# Patient Record
Sex: Female | Born: 1980 | Race: White | Hispanic: No | Marital: Single | Smoking: Former smoker
Health system: Southern US, Community
[De-identification: ages and names within clinical notes are randomized; demographics above are authoritative.]

## PROBLEM LIST (undated history)

## (undated) DIAGNOSIS — K900 Celiac disease: Secondary | ICD-10-CM

## (undated) DIAGNOSIS — K589 Irritable bowel syndrome without diarrhea: Secondary | ICD-10-CM

## (undated) DIAGNOSIS — K219 Gastro-esophageal reflux disease without esophagitis: Secondary | ICD-10-CM

## (undated) HISTORY — DX: Gastro-esophageal reflux disease without esophagitis: K21.9

## (undated) HISTORY — DX: Irritable bowel syndrome, unspecified: K58.9

## (undated) HISTORY — DX: Celiac disease: K90.0

---

## 2006-03-26 HISTORY — PX: KNEE ARTHROSCOPY: SUR90

## 2007-10-25 HISTORY — PX: BREAST REDUCTION SURGERY: SHX8

## 2010-03-26 HISTORY — PX: COLONOSCOPY: SHX174

## 2010-03-26 HISTORY — PX: UPPER GASTROINTESTINAL ENDOSCOPY: SHX188

## 2012-03-13 ENCOUNTER — Ambulatory Visit (INDEPENDENT_AMBULATORY_CARE_PROVIDER_SITE_OTHER): Payer: PRIVATE HEALTH INSURANCE | Admitting: Emergency Medicine

## 2012-03-13 VITALS — BP 107/69 | HR 78 | Temp 98.0°F | Resp 16 | Ht 65.0 in | Wt 162.2 lb

## 2012-03-13 DIAGNOSIS — R6883 Chills (without fever): Secondary | ICD-10-CM

## 2012-03-13 DIAGNOSIS — J029 Acute pharyngitis, unspecified: Secondary | ICD-10-CM

## 2012-03-13 DIAGNOSIS — R059 Cough, unspecified: Secondary | ICD-10-CM

## 2012-03-13 DIAGNOSIS — R05 Cough: Secondary | ICD-10-CM

## 2012-03-13 DIAGNOSIS — R0602 Shortness of breath: Secondary | ICD-10-CM

## 2012-03-13 DIAGNOSIS — R51 Headache: Secondary | ICD-10-CM

## 2012-03-13 DIAGNOSIS — J40 Bronchitis, not specified as acute or chronic: Secondary | ICD-10-CM

## 2012-03-13 LAB — POCT INFLUENZA A/B
Influenza A, POC: NEGATIVE
Influenza B, POC: NEGATIVE

## 2012-03-13 LAB — POCT RAPID STREP A (OFFICE): Rapid Strep A Screen: NEGATIVE

## 2012-03-13 MED ORDER — AZITHROMYCIN 250 MG PO TABS: ORAL_TABLET | ORAL | Status: DC

## 2012-03-13 MED ORDER — HYDROCODONE-HOMATROPINE 5-1.5 MG/5ML PO SYRP: 5.0000 mL | ORAL_SOLUTION | Freq: Three times a day (TID) | ORAL | Status: DC | PRN

## 2012-03-13 NOTE — Progress Notes (Signed)
  Subjective:    Patient ID: Alyssa Koch, female    DOB: 1980/10/24, 31 y.o.   MRN: [id number]  HPI  Sore throat and headache yesterday SOB and cough today Patient denies fever Patient has worked a lot of hours at work recently Patient is from [place], moved her last year and has no history of allergies Patient quit smoking recently Review of Systems     Objective:   Physical Exam TMs are clear. Nose is normal. Neck is supple. Chest exam reveals occasional rhonchi there are no rales. Throat is injected.    Results for orders placed in visit on 03/13/12  POCT RAPID STREP A (OFFICE)      Component Value Range   Rapid Strep A Screen Negative  Negative  POCT INFLUENZA A/B      Component Value Range   Influenza A, POC Negative     Influenza B, POC Negative       Assessment & Plan:  Patient about the risk of infection with bronchitis. We'll treat with a Z-Pak and Hycodan.

## 2012-03-13 NOTE — Patient Instructions (Addendum)

## 2012-04-03 ENCOUNTER — Ambulatory Visit (INDEPENDENT_AMBULATORY_CARE_PROVIDER_SITE_OTHER): Payer: PRIVATE HEALTH INSURANCE | Admitting: Family Medicine

## 2012-04-03 VITALS — BP 110/70 | HR 73 | Temp 99.4°F | Resp 16 | Ht 66.0 in | Wt 167.0 lb

## 2012-04-03 DIAGNOSIS — R51 Headache: Secondary | ICD-10-CM

## 2012-04-03 DIAGNOSIS — J111 Influenza due to unidentified influenza virus with other respiratory manifestations: Secondary | ICD-10-CM

## 2012-04-03 DIAGNOSIS — R6889 Other general symptoms and signs: Secondary | ICD-10-CM

## 2012-04-03 DIAGNOSIS — R059 Cough, unspecified: Secondary | ICD-10-CM

## 2012-04-03 DIAGNOSIS — R05 Cough: Secondary | ICD-10-CM

## 2012-04-03 LAB — POCT INFLUENZA A/B
Influenza A, POC: NEGATIVE
Influenza B, POC: NEGATIVE

## 2012-04-03 MED ORDER — OSELTAMIVIR PHOSPHATE 75 MG PO CAPS: 75.0000 mg | ORAL_CAPSULE | Freq: Two times a day (BID) | ORAL | Status: DC

## 2012-04-03 NOTE — Progress Notes (Signed)
[facility]:  Office Visit  Chief Complaint:  Chief Complaint  Patient presents with  . Shortness of Breath  . Chills  . Fever    HPI: Alyssa Koch is a 32 y.o. female who complains of  HA, fever, chills x 1 day. Was here on 12/19 with similar sxs but this is worse and given z pack and felt better. She took nyquil, zycam, and air borne without relief . Feels extremely tired. . Sxs started yesterday. Exhauseted, msk aches and pains. 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    Past Medical History  Diagnosis Date  . GERD (gastroesophageal reflux disease)   . Celiac disease    No past surgical history on file. History   Social History  . Marital Status: Single    Spouse Name: N/A    Number of Children: N/A  . Years of Education: N/A   Social History Main Topics  . Smoking status: Never Smoker   . Smokeless tobacco: None  . Alcohol Use: 0.0 oz/week    3-5 drink(s) per week  . Drug Use: No  . Sexually Active: Yes    Birth Control/ Protection: None   Other Topics Concern  . None   Social History Narrative  . None   Family History  Problem Relation Age of Onset  . Fibromyalgia Mother   . Celiac disease Sister   . Diabetes Maternal Grandfather   . Diabetes Sister   . Celiac disease Sister    No Known Allergies Prior to Admission medications   Medication Sig Start Date End Date Taking? Authorizing Provider  azithromycin (ZITHROMAX) 250 MG tablet Take 2 tabs PO x 1 dose, then 1 tab PO QD x 4 days 03/13/12   Collene Gobble, MD  HYDROcodone-homatropine [facility]) 5-1.5 MG/5ML syrup Take 5 mLs by mouth every 8 (eight) hours as needed for cough. 03/13/12   Collene Gobble, MD     ROS: The patient denies night sweats, unintentional weight loss, chest pain, palpitations, wheezing, dyspnea on exertion, nausea, vomiting, abdominal pain, dysuria, hematuria, melena, numbness, or tingling.   All other systems have been reviewed and were otherwise  negative with the exception of those mentioned in the HPI and as above.    PHYSICAL EXAM: Filed Vitals:   04/03/12 1736  BP: 110/70  Pulse: 73  Temp: 99.4 F (37.4 C)  Resp: 16  Spo2                 99%  Filed Vitals:   04/03/12 1736  Height: 5\' 6"  (1.676 m)  Weight: 167 lb (75.751 kg)   Body mass index is 26.95 kg/(m^2).  General: Alert, tired appearing HEENT:  Normocephalic, atraumatic, oropharynx patent. TM nl. No sinus tenderness. + erythematoous throat. No exudates. + nasal congestion, erythema Cardiovascular:  Regular rate and rhythm, no rubs murmurs or gallops.  No Carotid bruits, radial pulse intact. No pedal edema.  Respiratory: Clear to auscultation bilaterally.  No wheezes, rales, or rhonchi.  No cyanosis, no use of accessory musculature GI: No organomegaly, abdomen is soft and non-tender, positive bowel sounds.  No masses. Skin: No rashes. Neurologic: Facial musculature symmetric. Psychiatric: Patient is appropriate throughout our interaction. Lymphatic: No cervical lymphadenopathy Musculoskeletal: Gait intact.   LABS: Results for orders placed in visit on 04/03/12  POCT INFLUENZA A/B      Component Value Range   Influenza A, POC Negative     Influenza B, POC  Negative       EKG/XRAY:   Primary read interpreted by Dr. Conley Rolls at [facility].   ASSESSMENT/PLAN: Encounter Diagnoses  Name Primary?  . Cough Yes  . Headache   . Flu-like symptoms    Since she has new onset flu like symptoms < 48 hrs I will treat as if she has the flu Will give Tamiflu 75 mg PO BID x 5 days F/u prn Work note given 3 days.  Push fluids, tulenol, motrin prn. If sxs worsen then f/u CXR       Samar Dass PHUONG, DO 04/03/2012 7:14 PM

## 2012-10-25 ENCOUNTER — Ambulatory Visit (INDEPENDENT_AMBULATORY_CARE_PROVIDER_SITE_OTHER): Payer: PRIVATE HEALTH INSURANCE | Admitting: Emergency Medicine

## 2012-10-25 ENCOUNTER — Ambulatory Visit: Payer: PRIVATE HEALTH INSURANCE

## 2012-10-25 VITALS — BP 100/64 | HR 60 | Temp 97.9°F | Resp 16 | Ht 66.0 in | Wt 160.4 lb

## 2012-10-25 DIAGNOSIS — M79674 Pain in right toe(s): Secondary | ICD-10-CM

## 2012-10-25 DIAGNOSIS — M79609 Pain in unspecified limb: Secondary | ICD-10-CM

## 2012-10-25 MED ORDER — MELOXICAM 15 MG PO TABS: 15.0000 mg | ORAL_TABLET | Freq: Every day | ORAL | Status: DC

## 2012-10-25 NOTE — Progress Notes (Signed)
  Subjective:    Patient ID: Alyssa Koch, female    DOB: [birth year], 32 y.o.   MRN: [id number]  HPI 32 y.o. Female presents to clinic today for right foot pain x 1 month. [place] and stepped in a hole. She has had mild pain on dorsal aspect of foot greatest in her big toe but she has just been taking ibuprofen and dealing with pain. She describes pain as aching and worse when she stands on her toes or has to extend toes.Yesterday and last night she states she had excruciating pain in right big toe. She has noticed redness and swelling in the area. She states she wore different shoes yesterday to work and maybe that aggravated her pain. 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 She has never injured this foot before. She has family history of bunions. no family history of gout.    Review of Systems  Musculoskeletal: Positive for joint swelling. Negative for gait problem.  All other systems reviewed and are negative.      Objective:   Physical Exam  Nursing note and vitals reviewed. Constitutional: She is oriented to person, place, and time. Vital signs are normal. She appears well-developed and well-nourished.  HENT:  Head: Normocephalic and atraumatic.  Right Ear: External ear normal.  Left Ear: External ear normal.  Eyes: Conjunctivae are normal.  Neck: Normal range of motion. Neck supple.  Cardiovascular: Normal rate, regular rhythm and normal heart sounds.   Pulmonary/Chest: Effort normal and breath sounds normal.  Musculoskeletal: Normal range of motion. She exhibits tenderness (right great toe).       Right ankle: Normal. She exhibits normal range of motion, no swelling and normal pulse.       Feet:  Tender to palpation over the MTP joint. Pain with extension and abduction. Mild erythema and swelling. No warmth to palpation. Normal pulses and sensation present. Normal capillary refill.   Neurological: She is alert and oriented  to person, place, and time. She has normal strength and normal reflexes.  Skin: Skin is warm, dry and intact.  Psychiatric: She has a normal mood and affect. Her speech is normal and behavior is normal. Judgment and thought content normal. Cognition and memory are normal.    Right Great Toe radiograph primary reading by Dr. Cleta Alberts. No degenerative changes or acute processes.    Assessment & Plan:  Pain of great toe, right - Plan: DG Toe Great Right, meloxicam (MOBIC) 15 MG tablet  Patient appears to have sprain of right great toe MTP. Instructed to stop Ibuprofen while taking Meloxicam. She was fitted for post op shoe and instructed to wear it throughout day to help support her toe. Advised her to wear supportive shoes, ice, and try to keep off of foot as much as possible. Instructed her that if she is still having pain in 2 weeks please come back for further evaluation.

## 2012-10-25 NOTE — Patient Instructions (Addendum)
Stop Ibuprofen while taking Meloxicam Wear post op shoe throughout day to help support toe Wear supportive shoes, ice, and try to keep off of foot as much as possible If you are still having pain in 2 weeks please come back for further evaluation

## 2012-10-25 NOTE — Progress Notes (Signed)
I have examined this patient along with the student and agree.  

## 2012-11-11 ENCOUNTER — Emergency Department (HOSPITAL_COMMUNITY)
Admission: EM | Admit: 2012-11-11 | Discharge: 2012-11-12 | Disposition: A | Discharge status: Home / Self Care | Payer: PRIVATE HEALTH INSURANCE | Attending: Emergency Medicine | Admitting: Emergency Medicine

## 2012-11-11 ENCOUNTER — Emergency Department (HOSPITAL_COMMUNITY): Payer: PRIVATE HEALTH INSURANCE

## 2012-11-11 ENCOUNTER — Encounter (HOSPITAL_COMMUNITY): Payer: Self-pay | Admitting: Emergency Medicine

## 2012-11-11 DIAGNOSIS — Z3202 Encounter for pregnancy test, result negative: Secondary | ICD-10-CM | POA: Insufficient documentation

## 2012-11-11 DIAGNOSIS — R110 Nausea: Secondary | ICD-10-CM | POA: Insufficient documentation

## 2012-11-11 DIAGNOSIS — F17200 Nicotine dependence, unspecified, uncomplicated: Secondary | ICD-10-CM | POA: Insufficient documentation

## 2012-11-11 DIAGNOSIS — Z8719 Personal history of other diseases of the digestive system: Secondary | ICD-10-CM | POA: Insufficient documentation

## 2012-11-11 DIAGNOSIS — R197 Diarrhea, unspecified: Secondary | ICD-10-CM | POA: Insufficient documentation

## 2012-11-11 DIAGNOSIS — R6883 Chills (without fever): Secondary | ICD-10-CM | POA: Insufficient documentation

## 2012-11-11 DIAGNOSIS — R1031 Right lower quadrant pain: Secondary | ICD-10-CM | POA: Insufficient documentation

## 2012-11-11 DIAGNOSIS — R109 Unspecified abdominal pain: Secondary | ICD-10-CM

## 2012-11-11 LAB — CBC WITH DIFFERENTIAL/PLATELET
Basophils Absolute: 0 10*3/uL (ref 0.0–0.1)
HCT: 41.4 % (ref 36.0–46.0)
Lymphocytes Relative: 30 % (ref 12–46)
Lymphs Abs: 3 10*3/uL (ref 0.7–4.0)
Neutro Abs: 6 10*3/uL (ref 1.7–7.7)
Platelets: 218 10*3/uL (ref 150–400)
RBC: 4.54 MIL/uL (ref 3.87–5.11)
RDW: 12.5 % (ref 11.5–15.5)
WBC: 10 10*3/uL (ref 4.0–10.5)

## 2012-11-11 LAB — URINALYSIS, ROUTINE W REFLEX MICROSCOPIC
Bilirubin Urine: NEGATIVE
Glucose, UA: NEGATIVE mg/dL
Hgb urine dipstick: NEGATIVE
Ketones, ur: NEGATIVE mg/dL
Protein, ur: NEGATIVE mg/dL

## 2012-11-11 LAB — COMPREHENSIVE METABOLIC PANEL
ALT: 12 U/L (ref 0–35)
AST: 15 U/L (ref 0–37)
Alkaline Phosphatase: 48 U/L (ref 39–117)
CO2: 26 mEq/L (ref 19–32)
Chloride: 103 mEq/L (ref 96–112)
GFR calc non Af Amer: >90 mL/min (ref >90)
Sodium: 138 mEq/L (ref 135–145)
Total Bilirubin: 0.2 mg/dL — ABNORMAL LOW (ref 0.3–1.2)

## 2012-11-11 LAB — POCT PREGNANCY, URINE: Preg Test, Ur: NEGATIVE

## 2012-11-11 MED ORDER — IOHEXOL 300 MG/ML  SOLN
50.0000 mL | Freq: Once | INTRAMUSCULAR | Status: AC | PRN
  Administered 2012-11-11: 50 mL via ORAL

## 2012-11-11 MED ORDER — IOHEXOL 300 MG/ML  SOLN
100.0000 mL | Freq: Once | INTRAMUSCULAR | Status: AC | PRN
  Administered 2012-11-11: 100 mL via INTRAVENOUS

## 2012-11-11 MED ORDER — ONDANSETRON HCL 4 MG/2ML IJ SOLN
4.0000 mg | Freq: Once | INTRAMUSCULAR | Status: AC
  Administered 2012-11-11: 4 mg via INTRAVENOUS
  Filled 2012-11-11: qty 2

## 2012-11-11 MED ORDER — SODIUM CHLORIDE 0.9 % IV BOLUS (SEPSIS)
1000.0000 mL | Freq: Once | INTRAVENOUS | Status: AC
  Administered 2012-11-11: 1000 mL via INTRAVENOUS

## 2012-11-11 MED ORDER — MORPHINE SULFATE 4 MG/ML IJ SOLN
4.0000 mg | Freq: Once | INTRAMUSCULAR | Status: AC
  Administered 2012-11-11: 4 mg via INTRAVENOUS
  Filled 2012-11-11: qty 1

## 2012-11-11 NOTE — ED Notes (Signed)
Pt states she has celiac disease since about 2 or 3 today she has had severe abd pain   Pt states she has diarrhea and nausea without vomiting

## 2012-11-11 NOTE — ED Provider Notes (Signed)
TIME SEEN: 10:03 PM  CHIEF COMPLAINT: Abdominal pain, nausea, diarrhea  HPI: Patient is a 32 year old female with a history of celiac disease and acid reflux who presents emergency department with acute onset lower abdominal pain worse in the right lower corner that started tonight after eating a steak salad. She has had nausea and diarrhea but no vomiting. She has had chills but no fever. No dysuria or hematuria. No vaginal bleeding or discharge. No prior history of abdominal surgery. No sick contacts. She doubts any bad food exposure. No recent hospitalization or antibiotic use.  ROS: See HPI Constitutional: no fever  Eyes: no drainage  ENT: no runny nose   Cardiovascular:  no chest pain  Resp: no SOB  GI: no vomiting GU: no dysuria Integumentary: no rash  Allergy: no hives  Musculoskeletal: no leg swelling  Neurological: no slurred speech ROS otherwise negative  PAST MEDICAL HISTORY/PAST SURGICAL HISTORY:  Past Medical History  Diagnosis Date  . GERD (gastroesophageal reflux disease)   . Celiac disease   . Celiac disease     MEDICATIONS:  Prior to Admission medications   Medication Sig Start Date End Date Taking? Authorizing Provider  ibuprofen (ADVIL,MOTRIN) 200 MG tablet Take 600 mg by mouth every 8 (eight) hours as needed for pain.   Yes Historical Provider, MD    ALLERGIES:  No Known Allergies  SOCIAL HISTORY:  History  Substance Use Topics  . Smoking status: Current Every Day Smoker    Types: Cigarettes  . Smokeless tobacco: Not on file  . Alcohol Use: 0.0 oz/week    3-5 drink(s) per week     Comment: social    FAMILY HISTORY: Family History  Problem Relation Age of Onset  . Fibromyalgia Mother   . Celiac disease Sister   . Diabetes Maternal Grandfather   . Diabetes Sister   . Celiac disease Sister     EXAM: BP 133/87  Pulse 72  Temp(Src) 98 F (36.7 C) (Oral)  Resp 20  SpO2 99%  LMP 11/10/2012 CONSTITUTIONAL: Alert and oriented and responds  appropriately to questions. Appears uncomfortable, tearful, well-hydrated, nontoxic HEAD: Normocephalic EYES: Conjunctivae clear, PERRL ENT: normal nose; no rhinorrhea; moist mucous membranes; pharynx without lesions noted NECK: Supple, no meningismus, no LAD  CARD: RRR; S1 and S2 appreciated; no murmurs, no clicks, no rubs, no gallops RESP: Normal chest excursion without splinting or tachypnea; breath sounds clear and equal bilaterally; no wheezes, no rhonchi, no rales,  ABD/GI: Normal bowel sounds; non-distended; soft, tender to palpation in the right lower quadrant worse than the left lower quadrant, no rebound, no guarding BACK:  The back appears normal and is non-tender to palpation, there is no CVA tenderness EXT: Normal ROM in all joints; non-tender to palpation; no edema; normal capillary refill; no cyanosis    SKIN: Normal color for age and race; warm NEURO: Moves all extremities equally PSYCH: The patient's mood and manner are appropriate. Grooming and personal hygiene are appropriate.  MEDICAL DECISION MAKING: Patient with right lower quadrant pain, nausea and diarrhea. Will obtain labs, urinalysis, CT scan. We'll give IV pain medication, antiemetics and IV fluids. Differential diagnosis includes appendicitis, UTI and colitis. Patient denies any GU symptoms. Pain does not appear pelvic in nature.  ED PROGRESS: CT scan, urine, labs unremarkable. Patient reports feeling much better. Denies wanting pelvic exam today. We'll discharge home with pain meds, antiemetics, PCP followup, customary usual return precautions. Possible viral illness versus bad food exposure.  Layla Maw Xuan Mateus,  DO 11/12/12 0000

## 2012-11-12 ENCOUNTER — Encounter: Payer: Self-pay | Admitting: Internal Medicine

## 2012-11-12 MED ORDER — OXYCODONE-ACETAMINOPHEN 5-325 MG PO TABS: 1.0000 | ORAL_TABLET | ORAL | Status: AC | PRN

## 2012-11-12 MED ORDER — ONDANSETRON HCL 4 MG PO TABS: 4.0000 mg | ORAL_TABLET | Freq: Four times a day (QID) | ORAL | Status: AC

## 2012-11-12 NOTE — ED Notes (Signed)
Notified MD of heart rate.  Ok to d/c home.

## 2012-12-15 ENCOUNTER — Ambulatory Visit (INDEPENDENT_AMBULATORY_CARE_PROVIDER_SITE_OTHER): Payer: PRIVATE HEALTH INSURANCE | Admitting: Internal Medicine

## 2012-12-15 ENCOUNTER — Encounter: Payer: Self-pay | Admitting: Internal Medicine

## 2012-12-15 ENCOUNTER — Other Ambulatory Visit (INDEPENDENT_AMBULATORY_CARE_PROVIDER_SITE_OTHER): Payer: PRIVATE HEALTH INSURANCE

## 2012-12-15 VITALS — BP 100/60 | HR 72 | Ht 66.0 in | Wt 158.2 lb

## 2012-12-15 DIAGNOSIS — K900 Celiac disease: Secondary | ICD-10-CM

## 2012-12-15 LAB — FERRITIN: Ferritin: 9.4 ng/mL — ABNORMAL LOW (ref 10.0–291.0)

## 2012-12-15 LAB — FOLATE: Folate: 8.8 ng/mL (ref >5.9)

## 2012-12-15 NOTE — Patient Instructions (Addendum)
Your physician has requested that you go to the basement for lab work before leaving today.  We will obtain your records from the Dr. In [place] with the ROI you signed for [place] today.   Please come back and see [place] in 3 months.  You will be contacted about a nutrition consult.   I appreciate the opportunity to care for you.

## 2012-12-16 ENCOUNTER — Encounter: Payer: Self-pay | Admitting: Internal Medicine

## 2012-12-16 DIAGNOSIS — K900 Celiac disease: Secondary | ICD-10-CM | POA: Insufficient documentation

## 2012-12-16 LAB — VITAMIN D 25 HYDROXY (VIT D DEFICIENCY, FRACTURES): Vit D, 25-Hydroxy: 49 ng/mL (ref 30–89)

## 2012-12-16 NOTE — Progress Notes (Signed)
Subjective:    Patient ID: Alyssa Koch, female    DOB: [birth year], 32 y.o.   MRN: [id number]  HPI The patient is a very pleasant single white woman with a diagnosis of celiac disease made in 2012 when she was living in working in [place]. It sounds like she had antibody levels and EGD with biopsy. She has been on a mostly gluten-free diet but admits that she drinks regular beer and eat some foods with gluten in them. She had done okay with that, after a strict adherence to the diet, at first. However more lately she is complaining of bloating and gas and nausea. She has had some loose stools. She's had diffuse abdominal cramps. She has a lot of aches and pains and fatigue at times as well. She works as a Engineer, mining at the [employer] and does have physical demands that could produce those problems. Her GI review of systems is otherwise negative. She has had a colonoscopy in the past as well. No Known Allergies Outpatient Prescriptions Prior to Visit  Medication Sig Dispense Refill  . ibuprofen (ADVIL,MOTRIN) 200 MG tablet Take 600 mg by mouth every 8 (eight) hours as needed for pain.      Marland Kitchen ondansetron (ZOFRAN) 4 MG tablet Take 1 tablet (4 mg total) by mouth every 6 (six) hours.  12 tablet  0  . oxyCODONE-acetaminophen (PERCOCET/ROXICET) 5-325 MG per tablet Take 1 tablet by mouth every 4 (four) hours as needed for pain.  6 tablet  0   No facility-administered medications prior to visit.   Past Medical History  Diagnosis Date  . GERD (gastroesophageal reflux disease)   . Celiac disease   . Irritable bowel syndrome    Past Surgical History  Procedure Laterality Date  . Breast surgery    . Knee surgery    . Colonoscopy    . Upper gastrointestinal endoscopy     History   Social History  . Marital Status: Single    Spouse Name: N/A    Number of Children: N/A  . Years of Education: N/A   Occupational History  . Bartender/Waitress    Social History Main Topics  .  Smoking status: Current Every Day Smoker    Types: Cigarettes  . Smokeless tobacco: Former Neurosurgeon     Comment: ecig  . Alcohol Use: 0.0 oz/week    3-5 drink(s) per week     Comment: social  . Drug Use: No  . Sexual Activity: Yes    Birth Control/ Protection: None   Other Topics Concern  . None   Social History Narrative   Single. Education: [employer]. Exercise: Gym/run 2 times a week for 1-2 hours.   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 Fibromyalgia Mother   . Celiac disease Sister   . Diabetes Maternal Grandfather   . Diabetes Sister   . Celiac disease Sister    Review of Systems Positive for those things mentioned in the history of present illness. She has some insomnia cough and back pain. All other review of systems negative.    Objective:   Physical Exam General:  Well-developed, well-nourished and in no acute distress Eyes:  anicteric. ENT:   Mouth and posterior pharynx free of lesions.  Neck:   supple w/o thyromegaly or mass.  Lungs: Clear to auscultation bilaterally. Heart:  S1S2, no rubs, murmurs, gallops.  Abdomen:  soft, non-tender, no hepatosplenomegaly, hernia, or mass and BS+.  Lymph:  no cervical or supraclavicular adenopathy. Extremities:   no edema Skin   no rash. Positive tattoo Neuro:  A&O x 3.  Psych:  appropriate mood and  Affect.   Data Reviewed: I have requested records of her 2012 workup.    Assessment & Plan:   1. Celiac disease

## 2012-12-16 NOTE — Assessment & Plan Note (Addendum)
Diagnosed in 2012. She has symptoms now. She is not completely gluten-free which is the likely cause though she indicates she was able to tolerate some gluten in the past. Plan for dietitian consult. I've explained that she should be totally gluten-free. I have given her information regarding celiac disease for up to date including several references about celiac size it may help her and occult gluten. Measurement of tissue transglutaminase antibodies. We'll also check vitamin B12 level, vitamin D level, folate, ferritin and IgA level. Results will be called to her. Plan for office followup in 3 months.

## 2012-12-18 NOTE — Progress Notes (Signed)
Quick Note:  Antibody levels for celiac disease are negative though not low. There are no high normal range. This could mean that she is getting some gluten exposure and know she is. Whether or not this is linked to her symptoms is not clear to me. Her iron is low at she should take ferrous sulfate 325 mg daily. I am awaiting the results from her original workup and then we'll call her. If she does not hear from [place] in 2 weeks she should call back. She may need additional testing or change in treatment. ______

## 2012-12-19 NOTE — Progress Notes (Signed)
Quick Note:  She already has celiac disease and has sxs - she needs to be gluten-free ______

## 2012-12-21 ENCOUNTER — Encounter: Payer: Self-pay | Admitting: Internal Medicine

## 2013-01-09 ENCOUNTER — Ambulatory Visit (INDEPENDENT_AMBULATORY_CARE_PROVIDER_SITE_OTHER): Payer: PRIVATE HEALTH INSURANCE | Admitting: Physician Assistant

## 2013-01-09 VITALS — BP 102/62 | HR 76 | Temp 99.7°F | Resp 18 | Ht 65.25 in | Wt 158.8 lb

## 2013-01-09 DIAGNOSIS — J029 Acute pharyngitis, unspecified: Secondary | ICD-10-CM

## 2013-01-09 DIAGNOSIS — J039 Acute tonsillitis, unspecified: Secondary | ICD-10-CM

## 2013-01-09 LAB — POCT CBC
Granulocyte percent: 84.4 %G — AB (ref 37–80)
HCT, POC: 41.9 % (ref 37.7–47.9)
Hemoglobin: 13.2 g/dL (ref 12.2–16.2)
Lymph, poc: 1.4 (ref 0.6–3.4)
MCH, POC: 31 pg (ref 27–31.2)
MCHC: 31.5 g/dL — AB (ref 31.8–35.4)
MCV: 98.3 fL — AB (ref 80–97)
MID (cbc): 0.6 (ref 0–0.9)
MPV: 9.9 fL (ref 0–99.8)
POC Granulocyte: 10.8 — AB (ref 2–6.9)
POC LYMPH PERCENT: 10.9 %L (ref 10–50)
POC MID %: 4.7 %M (ref 0–12)
Platelet Count, POC: 196 10*3/uL (ref 142–424)
RBC: 4.26 M/uL (ref 4.04–5.48)
RDW, POC: 13.9 %
WBC: 12.8 10*3/uL — AB (ref 4.6–10.2)

## 2013-01-09 LAB — POCT RAPID STREP A (OFFICE): Rapid Strep A Screen: NEGATIVE

## 2013-01-09 MED ORDER — AMOXICILLIN 875 MG PO TABS: 875.0000 mg | ORAL_TABLET | Freq: Two times a day (BID) | ORAL | Status: AC

## 2013-01-09 NOTE — Progress Notes (Signed)
Subjective:    Patient ID: Alyssa Koch, female    DOB: 1981-03-09, 32 y.o.   MRN: [id number]  Sore Throat  Associated symptoms include ear pain (right side) and headaches. Pertinent negatives include no congestion, coughing, shortness of breath, trouble swallowing or vomiting.  Headache  Associated symptoms include ear pain (right side), a fever, nausea and a sore throat. Pertinent negatives include no coughing, sinus pressure or vomiting.    32 year old female presents for evaluation of sore throat. Symptoms started acutely this morning and have progressively worsened.  Admits she noticed patches of white on her tonsils and has significant pain with swallowing. Denies nasal congestion, cough, vomiting, or abdominal pain. Has had slight nausea and subjective fever and chills.  She has taken ibuprofen which did not help much. No significant hx of strep or known exposures.    Patient is otherwise healthy with no other concerns today.   Works at [employer]    Review of Systems  Constitutional: Positive for fever and chills.  HENT: Positive for ear pain (right side) and sore throat. Negative for congestion, sinus pressure and trouble swallowing.   Respiratory: Negative for cough, shortness of breath and wheezing.   Gastrointestinal: Positive for nausea. Negative for vomiting.  Neurological: Positive for headaches.       Objective:   Physical Exam  Constitutional: She is oriented to person, place, and time. She appears well-developed and well-nourished.  HENT:  Head: Normocephalic and atraumatic.  Right Ear: Hearing, tympanic membrane, external ear and ear canal normal.  Left Ear: Hearing, tympanic membrane, external ear and ear canal normal.  Mouth/Throat: Uvula is midline and mucous membranes are normal. Oropharyngeal exudate and posterior oropharyngeal erythema (2+ tonsillar swelling) present. No posterior oropharyngeal edema or tonsillar abscesses.  Eyes: Conjunctivae are  normal.  Neck: Normal range of motion. Neck supple.  Cardiovascular: Normal rate, regular rhythm and normal heart sounds.   Pulmonary/Chest: Effort normal and breath sounds normal.  Lymphadenopathy:    She has cervical adenopathy.  Neurological: She is alert and oriented to person, place, and time.  Psychiatric: She has a normal mood and affect. Her behavior is normal. Judgment and thought content normal.      Results for orders placed in visit on 01/09/13  POCT RAPID STREP A (OFFICE)      Result Value Range   Rapid Strep A Screen Negative  Negative  POCT CBC      Result Value Range   WBC 12.8 (*) 4.6 - 10.2 K/uL   Lymph, poc 1.4  0.6 - 3.4   POC LYMPH PERCENT 10.9  10 - 50 %L   MID (cbc) 0.6  0 - 0.9   POC MID % 4.7  0 - 12 %M   POC Granulocyte 10.8 (*) 2 - 6.9   Granulocyte percent 84.4 (*) 37 - 80 %G   RBC 4.26  4.04 - 5.48 M/uL   Hemoglobin 13.2  12.2 - 16.2 g/dL   HCT, POC 56.2  13.0 - 47.9 %   MCV 98.3 (*) 80 - 97 fL   MCH, POC 31.0  27 - 31.2 pg   MCHC 31.5 (*) 31.8 - 35.4 g/dL   RDW, POC 86.5     Platelet Count, POC 196  142 - 424 K/uL   MPV 9.9  0 - 99.8 fL        Assessment & Plan:  Acute tonsillitis  Acute pharyngitis - Plan: POCT rapid strep A, POCT CBC, Culture,  Group A Strep, amoxicillin (AMOXIL) 875 MG tablet  Throat culture sent Start amoxicillin 875 mg bid x 10 days Continue ibuprofen 600-800 mg tid with food with Tylenol intermittently.  Out of work until 01/11/13 Follow up if symptoms worsen or fail to improve.

## 2013-01-10 LAB — CULTURE, GROUP A STREP

## 2013-01-22 ENCOUNTER — Ambulatory Visit: Payer: PRIVATE HEALTH INSURANCE | Admitting: Dietician

## 2013-03-11 ENCOUNTER — Ambulatory Visit: Payer: PRIVATE HEALTH INSURANCE | Admitting: Internal Medicine

## 2015-03-11 IMAGING — CT CT ABD-PELV W/ CM
1 of 2 series · 15 of 32 positions shown, 19 images · IV contrast (omnipaque)
Comparison: None.

CLINICAL DATA: Right lower quadrant pain.  History of celiac
disease.  Diarrhea and nausea.

CT ABDOMEN AND PELVIS WITH CONTRAST
TECHNIQUE: Multidetector CT imaging of the abdomen and pelvis was
performed following the standard protocol during bolus
administration of intravenous contrast.
Contrast: 100mL OMNIPAQUE IOHEXOL 300 MG/ML  SOLN, 50mL OMNIPAQUE
IOHEXOL 300 MG/ML  SOLN

[Series 2: abd/pel with · axial · 0.74mm/px · z∈[-414,-4]mm · 15 of 90 slices shown, 19 images]
[im 4/90  soft-tissue]
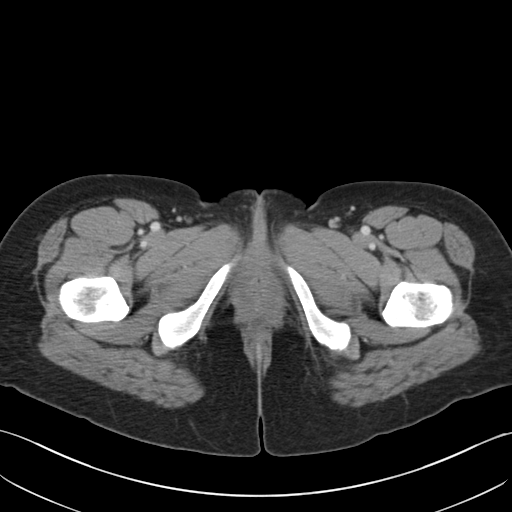
[im 4/90  bone]
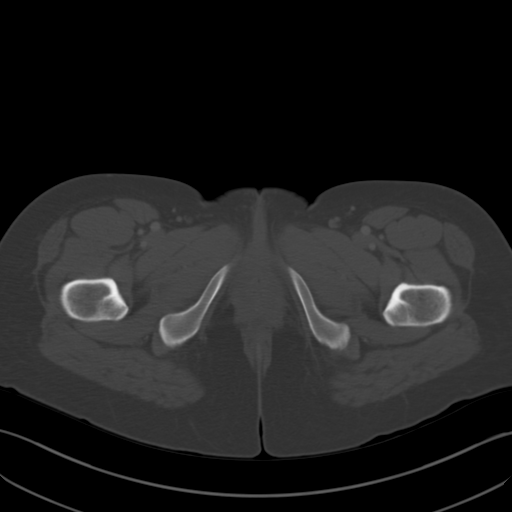
[im 11/90  soft-tissue]
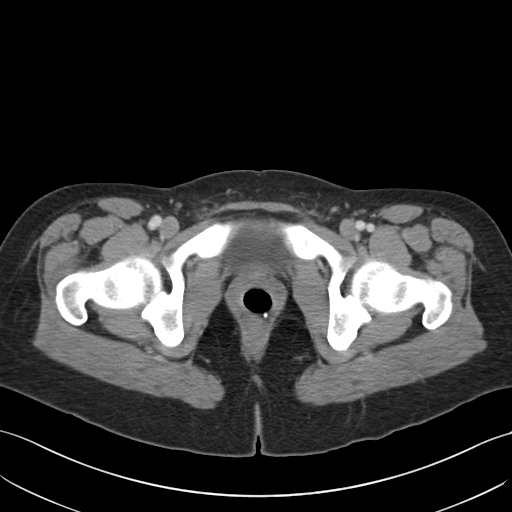
[im 18/90  soft-tissue]
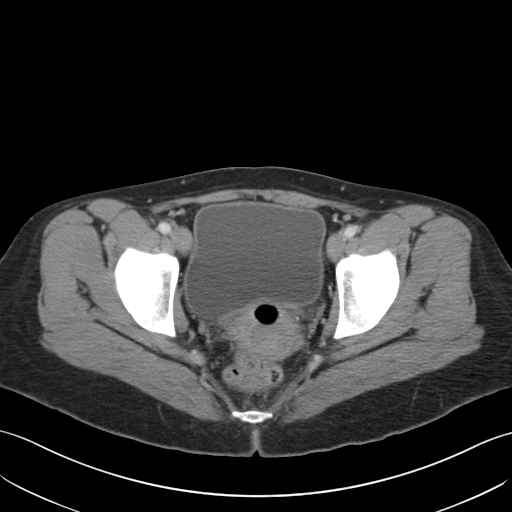
[im 25/90  soft-tissue]
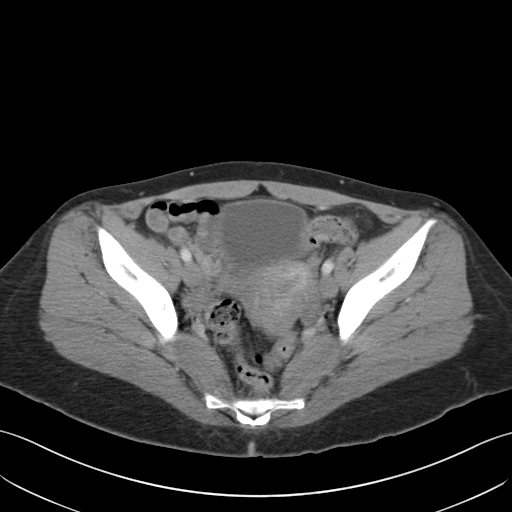
[im 33/90  soft-tissue]
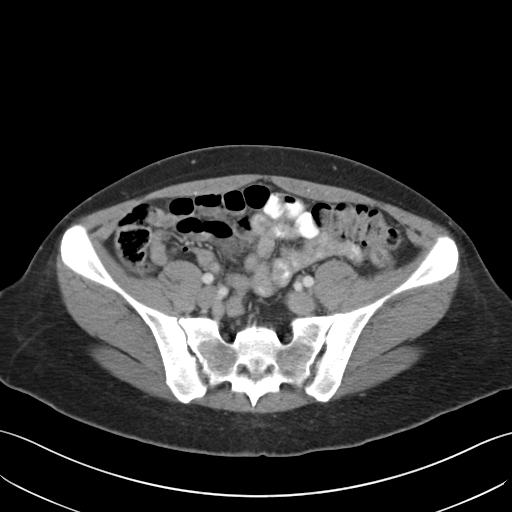
[im 40/90  soft-tissue]
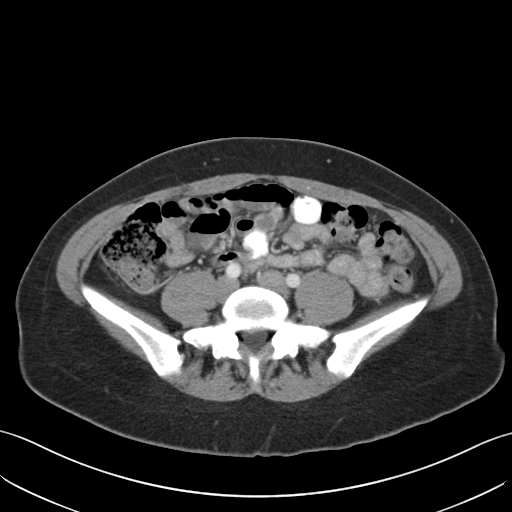
[im 47/90  soft-tissue]
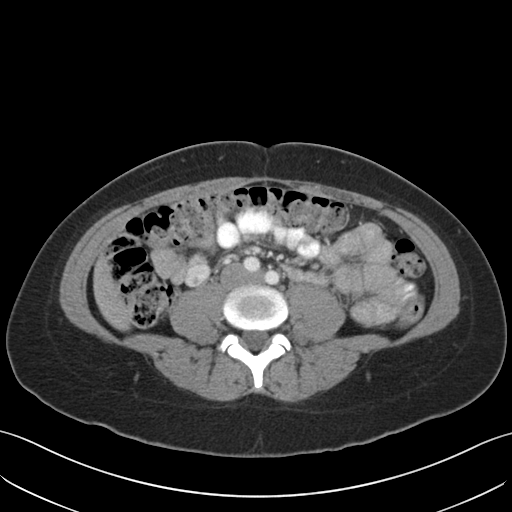
[im 50/90  soft-tissue]
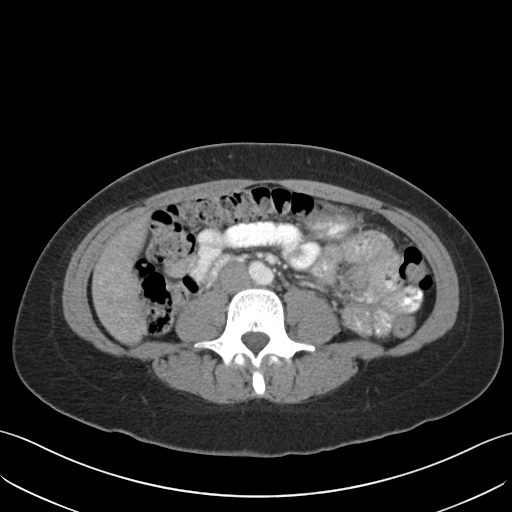
[im 57/90  soft-tissue]
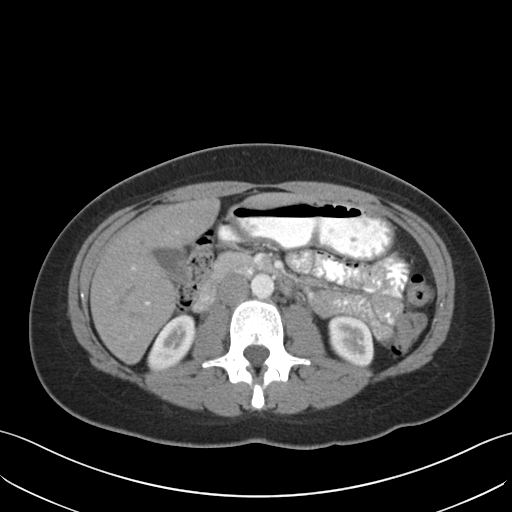
[im 57/90  bone]
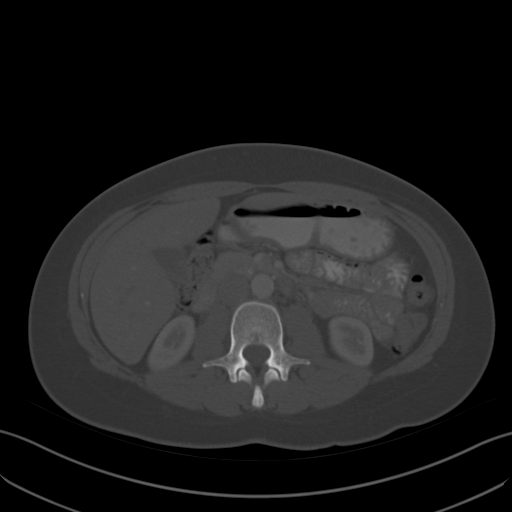
[im 65/90  soft-tissue]
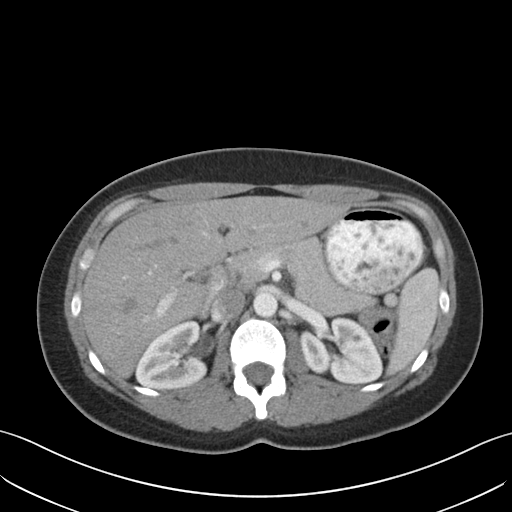
[im 72/90  soft-tissue]
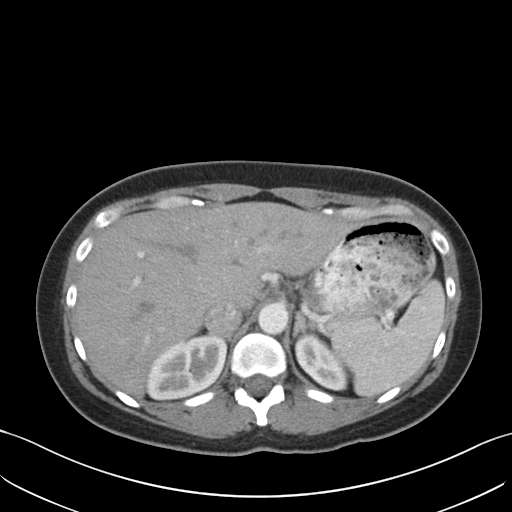
[im 75/90  lung]
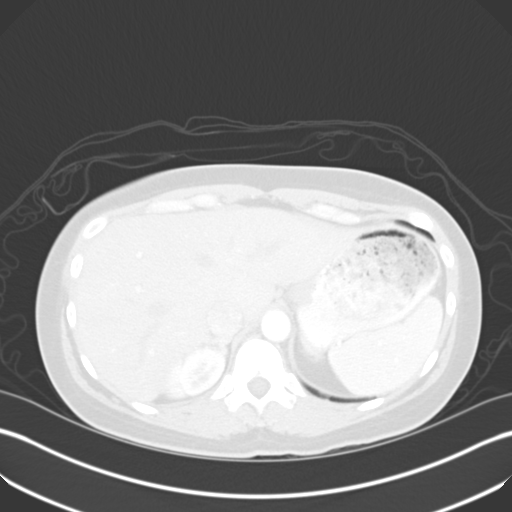
[im 79/90  soft-tissue]
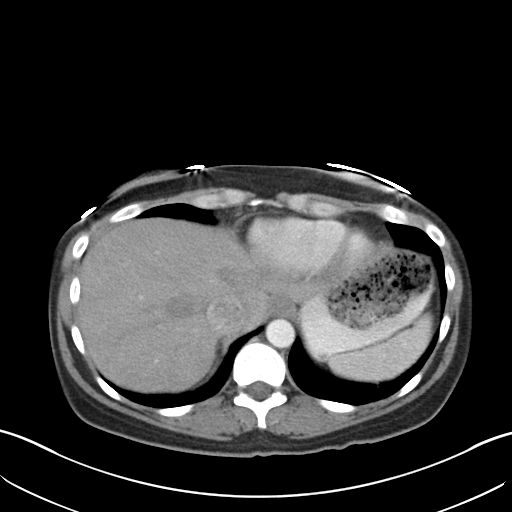
[im 79/90  lung]
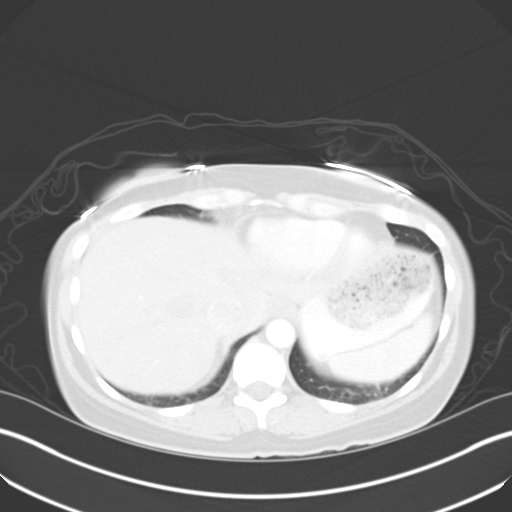
[im 82/90  lung]
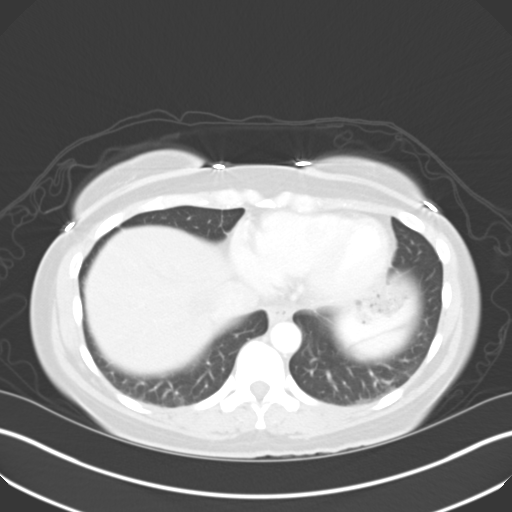
[im 86/90  soft-tissue]
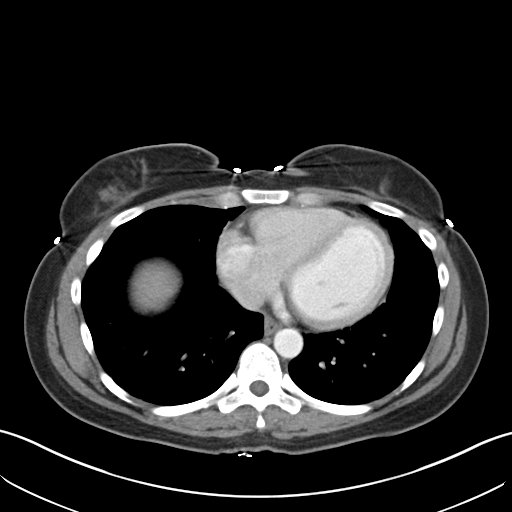
[im 86/90  lung]
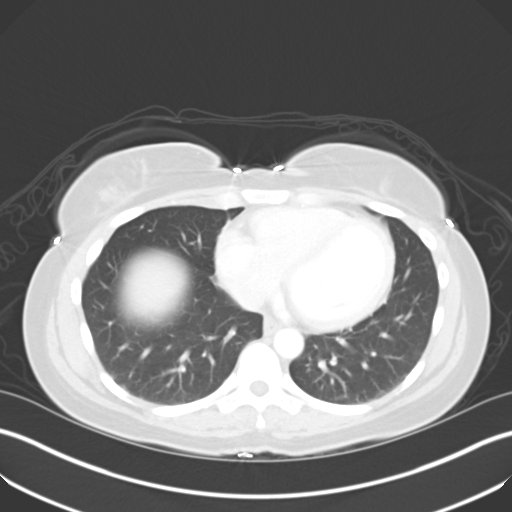

[15 of 32 positions shown; findings below may reference images not displayed]

FINDINGS: Mild dependent changes in the lung bases.

The liver, spleen, gallbladder, pancreas, adrenal glands, kidneys,
abdominal aorta, inferior vena cava, and retroperitoneal lymph
nodes are unremarkable.  The stomach and small bowel are
unremarkable.  No wall thickening is appreciated.  However, the
small bowel are not optimally distended for best evaluation.  Stool
filled colon without distension or wall thickening.  Small
accessory spleen.  No free air or free fluid in the abdomen.

Pelvis:  Uterus and ovaries are not enlarged.  Bladder wall is not
thickened.  No free or loculated pelvic fluid collections.  No
evidence of diverticulitis.  Appendix is normal.  No significant
pelvic lymphadenopathy.  Normal alignment of the lumbar spine.  No
destructive bone lesions appreciated.
IMPRESSION: No acute process demonstrated in the abdomen or pelvis.

## 2017-02-24 ENCOUNTER — Encounter (HOSPITAL_COMMUNITY): Payer: Self-pay | Admitting: Emergency Medicine

## 2017-02-24 ENCOUNTER — Other Ambulatory Visit: Payer: Self-pay

## 2017-02-24 ENCOUNTER — Emergency Department (HOSPITAL_COMMUNITY)
Admission: EM | Admit: 2017-02-24 | Discharge: 2017-02-24 | Disposition: A | Discharge status: Home / Self Care | Payer: PRIVATE HEALTH INSURANCE | Attending: Emergency Medicine | Admitting: Emergency Medicine

## 2017-02-24 DIAGNOSIS — Y929 Unspecified place or not applicable: Secondary | ICD-10-CM | POA: Insufficient documentation

## 2017-02-24 DIAGNOSIS — S0101XA Laceration without foreign body of scalp, initial encounter: Secondary | ICD-10-CM | POA: Insufficient documentation

## 2017-02-24 DIAGNOSIS — Y999 Unspecified external cause status: Secondary | ICD-10-CM | POA: Insufficient documentation

## 2017-02-24 DIAGNOSIS — Z79899 Other long term (current) drug therapy: Secondary | ICD-10-CM | POA: Insufficient documentation

## 2017-02-24 DIAGNOSIS — Z87891 Personal history of nicotine dependence: Secondary | ICD-10-CM | POA: Insufficient documentation

## 2017-02-24 DIAGNOSIS — Y9364 Activity, baseball: Secondary | ICD-10-CM | POA: Insufficient documentation

## 2017-02-24 DIAGNOSIS — W2107XA Struck by softball, initial encounter: Secondary | ICD-10-CM | POA: Insufficient documentation

## 2017-02-24 MED ORDER — LIDOCAINE-EPINEPHRINE (PF) 2 %-1:200000 IJ SOLN
INTRAMUSCULAR | Status: AC
  Filled 2017-02-24: qty 20

## 2017-02-24 MED ORDER — LIDOCAINE-EPINEPHRINE (PF) 2 %-1:200000 IJ SOLN: 10.0000 mL | Freq: Once | INTRAMUSCULAR | Status: DC

## 2017-02-24 NOTE — Discharge Instructions (Signed)
Staple removal in 7-10 days, monitor for severe headache ,vomiting, or other worsening symptoms

## 2017-02-24 NOTE — ED Triage Notes (Signed)
Pt states she was hit in the head by a softball. Denies LOC, double vision, N/V/. Noted to have laceration to back of head, bleeding controlled in triage. Ice pack applied PTA. Last tetanus between 5-10 years ago.

## 2017-02-24 NOTE — ED Provider Notes (Signed)
[facility]GENCY DEPARTMENT Provider Note   CSN: [id number] Arrival date & time: 02/24/17  1757     History   Chief Complaint Chief Complaint  Patient presents with  . Head Laceration    HPI Alyssa Koch is a 36 y.o. female.  HPI Patient presents to the emergency room for evaluation of a scalp laceration.  Patient was playing softball when she was hit in the back of the head by a softball.  She denies getting knocked out.  She denies any neck pain.  No vomiting.  No numbness or weakness.  She did sustain a laceration. she was able to control the bleeding with pressure.  Last tetanus shot was within 5-10 years. Past Medical History:  Diagnosis Date  . Celiac disease   . GERD (gastroesophageal reflux disease)   . Irritable bowel syndrome     Patient Active Problem List   Diagnosis Date Noted  . Celiac disease 12/16/2012    Past Surgical History:  Procedure Laterality Date  . BREAST REDUCTION SURGERY Bilateral 10/2007  . COLONOSCOPY  2012  . KNEE ARTHROSCOPY Left 2008  . UPPER GASTROINTESTINAL ENDOSCOPY  2012    OB History    No data available       Home Medications    Prior to Admission medications   Medication Sig Start Date End Date Taking? Authorizing Provider  amoxicillin (AMOXIL) 875 MG tablet Take 1 tablet (875 mg total) by mouth 2 (two) times daily. 01/09/13   Nelva NayMarte, Heather M, PA-C  ibuprofen (ADVIL,MOTRIN) 200 MG tablet Take 600 mg by mouth every 8 (eight) hours as needed for pain.    [provider]  ondansetron (ZOFRAN) 4 MG tablet Take 1 tablet (4 mg total) by mouth every 6 (six) hours. 11/12/12   Ward, Layla MawKristen N, DO  oxyCODONE-acetaminophen (PERCOCET/ROXICET) 5-325 MG per tablet Take 1 tablet by mouth every 4 (four) hours as needed for pain. 11/12/12   Ward, Layla MawKristen N, DO    Family History Family History  Problem Relation Age of Onset  . Fibromyalgia Mother   . Celiac disease Sister   . Diabetes Maternal Grandfather   . Diabetes  Sister   . Celiac disease Sister     Social History Social History   Tobacco Use  . Smoking status: Former Smoker    Types: Cigarettes  . Smokeless tobacco: Former NeurosurgeonUser  . Tobacco comment: ecig  Substance Use Topics  . Alcohol use: Yes    Alcohol/week: 0.0 oz    Types: 3 - 5 Standard drinks or equivalent per week    Comment: social  . Drug use: No     Allergies   Morphine and related   Review of Systems Review of Systems  All other systems reviewed and are negative.    Physical Exam Updated Vital Signs BP 125/81 (BP Location: Left Arm)   Pulse 83   Temp 98.4 F (36.9 C) (Oral)   Resp 18   Ht 1.676 m (5\' 6" )   Wt 79.4 kg (175 lb)   LMP 02/11/2017   SpO2 100%   BMI 28.25 kg/m   Physical Exam  Constitutional: She appears well-developed and well-nourished. No distress.  HENT:  Head: Normocephalic. Head is with laceration ( posterior occiput, clotted blood in the surrounding hair).  Right Ear: External ear normal.  Left Ear: External ear normal.  Eyes: Conjunctivae are normal. Right eye exhibits no discharge. Left eye exhibits no discharge. No scleral icterus.  Neck: Neck supple. No tracheal  deviation present.  Cardiovascular: Normal rate.  Pulmonary/Chest: Effort normal. No stridor. No respiratory distress.  Abdominal: She exhibits no distension.  Musculoskeletal: She exhibits no edema.       Cervical back: Normal.       Thoracic back: Normal.       Lumbar back: Normal.  Neurological: She is alert. Cranial nerve deficit: no gross deficits.  Skin: Skin is warm and dry. No rash noted.  Psychiatric: She has a normal mood and affect.  Nursing note and vitals reviewed.    ED Treatments / Results    Procedures .Marland Kitchen.Laceration Repair Date/Time: 02/24/2017 7:02 PM Performed by: Linwood DibblesKnapp, Colene Mines, MD Authorized by: Linwood DibblesKnapp, Jaaron Oleson, MD   Consent:    Consent obtained:  Verbal   Consent given by:  Patient   Risks discussed:  Infection, need for additional repair, pain,  poor cosmetic result and poor wound healing   Alternatives discussed:  No treatment and delayed treatment Universal protocol:    Procedure explained and questions answered to patient or proxy's satisfaction: yes     Relevant documents present and verified: yes     Test results available and properly labeled: yes     Imaging studies available: yes     Required blood products, implants, devices, and special equipment available: yes     Site/side marked: yes     Immediately prior to procedure, a time out was called: yes     Patient identity confirmed:  Verbally with patient Anesthesia (see MAR for exact dosages):    Anesthesia method:  Local infiltration   Local anesthetic:  Lidocaine 2% WITH epi Laceration details:    Location:  Scalp   Scalp location:  Occipital   Length (cm):  3   Depth (mm):  3 Repair type:    Repair type:  Simple Exploration:    Wound extent: no underlying fracture noted and no vascular damage noted     Contaminated: no   Treatment:    Amount of cleaning:  Standard   Irrigation solution: wound cleanser.   Irrigation method:  Syringe   Visualized foreign bodies/material removed: no   Skin repair:    Repair method:  Staples Approximation:    Approximation:  Close   Vermilion border: well-aligned   Post-procedure details:    Dressing:  Open (no dressing)   Patient tolerance of procedure:  Tolerated well, no immediate complications   (including critical care time)  Medications Ordered in ED Medications  lidocaine-EPINEPHrine (XYLOCAINE W/EPI) 2 %-1:200000 (PF) injection 10 mL (not administered)     Initial Impression / Assessment and Plan / ED Course  I have reviewed the triage vital signs and the nursing notes.  Pertinent labs & imaging results that were available during my care of the patient were reviewed by me and considered in my medical decision making (see chart for details).   Patient presented to the emergency room for evaluation of a scalp  laceration.  No signs of serious head injury.  Laceration was repaired with staples.  Discharged home with head injury precautions  Final Clinical Impressions(s) / ED Diagnoses   Final diagnoses:  Laceration of scalp, initial encounter    ED Discharge Orders    None       Linwood DibblesKnapp, Geet Hosking, MD 02/24/17 1904
